# Patient Record
Sex: Female | Born: 1960 | Race: White | Hispanic: No | Marital: Married | Smoking: Never smoker
Health system: Southern US, Community
[De-identification: ages and names within clinical notes are randomized; demographics above are authoritative.]

---

## 2008-09-06 ENCOUNTER — Ambulatory Visit (HOSPITAL_COMMUNITY): Admission: RE | Admit: 2008-09-06 | Discharge: 2008-09-06 | Payer: Self-pay | Admitting: Gastroenterology

## 2010-02-17 IMAGING — NM NM HEPATO W/GB/PHARM/[PERSON_NAME]
2 series · 12 of 12 positions shown · non-contrast
Comparison: 09/06/2008

CLINICAL DATA: Upper gastric pain.  Nausea.

NUCLEAR MEDICINE HEPATOBILIARY IMAGING WITH GALLBLADDER EF
TECHNIQUE: Sequential images of the abdomen were obtained [DATE]
minutes following intravenous administration of
radiopharmaceutical.  After slow intravenous infusion of 1.5 uCg
Cholecystokinin, gallbladder ejection fraction was determined.
Radiopharmaceutical:  5.4 mCi 0c-VVm Choletec

[Series 1: he hepato · 4.71mm/px · 6 of 30 frames shown (1 of 2)]
[frame 3/30]
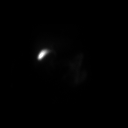
[frame 8/30]
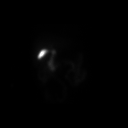
[frame 13/30]
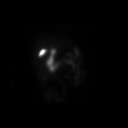
[frame 18/30]
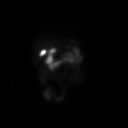
[frame 23/30]
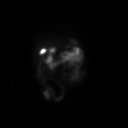
[frame 28/30]
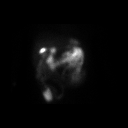

[Series 1: he hepato · 4.71mm/px · 6 of 60 frames shown (2 of 2)]
[frame 6/60]
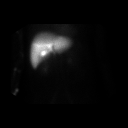
[frame 16/60]
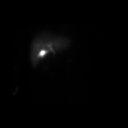
[frame 26/60]
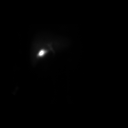
[frame 36/60]
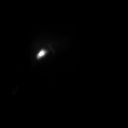
[frame 46/60]
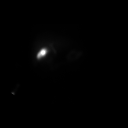
[frame 56/60]
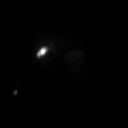

[12 of 12 positions shown; findings below may reference images not displayed]

FINDINGS: Early activity is identified within the liver.
Gallbladder activity is seen as early as 5 minutes.  There is
persistent filling of the gallbladder prior to the administration
of cholecystokinin.  Gallbladder ejection fraction is 85%, within
normal limits.

The patient did experience symptoms during CCK infusion.
IMPRESSION: Normal hepatobiliary scan.  No evidence for acute cholecystitis.
Normal gallbladder ejection fraction.

## 2017-05-11 ENCOUNTER — Encounter: Payer: Self-pay | Admitting: Emergency Medicine

## 2017-05-11 ENCOUNTER — Emergency Department
Admission: EM | Admit: 2017-05-11 | Discharge: 2017-05-11 | Disposition: A | Discharge status: Home / Self Care | Payer: BC Managed Care – PPO | Source: Home / Self Care

## 2017-05-11 DIAGNOSIS — H10211 Acute toxic conjunctivitis, right eye: Secondary | ICD-10-CM

## 2017-05-11 NOTE — ED Triage Notes (Signed)
Pt c/o right eye pain after splashing bleach water in it last night. States she flushed it with water.

## 2017-05-12 NOTE — ED Provider Notes (Signed)
[facility]    CSN: [id number] Arrival date & time: 05/11/17  1846     History   Chief Complaint Chief Complaint  Patient presents with  . Eye Problem    HPI Brenda Paul is a 57 y.o. female.   The history is provided by the patient. No language interpreter was used.  Eye Problem  Location:  Right eye Quality:  Aching Severity:  Mild Timing:  Constant Progression:  Worsening Chronicity:  New Context: not foreign body   Relieved by:  Nothing Ineffective treatments:  None tried Associated symptoms: inflammation and itching   Associated symptoms: no blurred vision and no crusting   Risk factors: no conjunctival hemorrhage    Pt reports she slashed bleach in her yesterday. History reviewed. No pertinent past medical history.  There are no active problems to display for this patient.   History reviewed. No pertinent surgical history.  OB History    No data available       Home Medications    Prior to Admission medications   Not on File    Family History History reviewed. No pertinent family history.  Social History Social History   Tobacco Use  . Smoking status: Never Smoker  . Smokeless tobacco: Never Used  Substance Use Topics  . Alcohol use: Not on file  . Drug use: Not on file     Allergies   Patient has no allergy information on record.   Review of Systems Review of Systems  Eyes: Positive for itching. Negative for blurred vision.  All other systems reviewed and are negative.    Physical Exam Triage Vital Signs ED Triage Vitals [05/11/17 1914]  Enc Vitals Group     BP 124/79     Pulse Rate 66     Resp      Temp 97.8 F (36.6 C)     Temp Source Oral     SpO2 100 %     Weight 150 lb (68 kg)     Height      Head Circumference      Peak Flow      Pain Score 0     Pain Loc      Pain Edu?      Excl. in GC?    No data found.  Updated Vital Signs BP 124/79 (BP Location: Right Arm)   Pulse 66   Temp  97.8 F (36.6 C) (Oral)   Wt 150 lb (68 kg)   SpO2 100%   Visual Acuity Right Eye Distance:   Left Eye Distance:   Bilateral Distance:    Right Eye Near: R Near: 20/30 Left Eye Near:  L Near: 20/25 Bilateral Near:  20/25  Physical Exam  Constitutional: She appears well-developed and well-nourished.  HENT:  Head: Normocephalic.  Right Ear: External ear normal.  Left Ear: External ear normal.  Eyes: Conjunctivae and EOM are normal. Pupils are equal, round, and reactive to light.  fluro no uptake slight injection medial aspect,  No evidence of burn no abrasion  Neck: Normal range of motion.  Skin: Skin is warm.  Psychiatric: She has a normal mood and affect.  Nursing note and vitals reviewed.    UC Treatments / Results  Labs (all labs ordered are listed, but only abnormal results are displayed) Labs Reviewed - No data to display  EKG  EKG Interpretation None       Radiology No results found.  Procedures Procedures (including critical care time)  Medications Ordered in UC Medications - No data to display   Initial Impression / Assessment and Plan / UC Course  I have reviewed the triage vital signs and the nursing notes.  Pertinent labs & imaging results that were available during my care of the patient were reviewed by me and considered in my medical decision making (see chart for details).     An After Visit Summary was printed and given to the patient.  Final Clinical Impressions(s) / UC Diagnoses   Final diagnoses:  Chemical conjunctivitis of right eye    ED Discharge Orders    None       Controlled Substance Prescriptions [place] Controlled Substance Registry consulted? Not Applicable   Elson AreasSofia, Leslie K, [place]PA-C 05/12/17 [id number]

## 2023-04-06 LAB — EXTERNAL GENERIC LAB PROCEDURE: COLOGUARD: NEGATIVE
